# Patient Record
Sex: Male | Born: 1990 | Race: White | Hispanic: No | Marital: Single
Health system: Southern US, Community
[De-identification: ages and names within clinical notes are randomized; demographics above are authoritative.]

---

## 2014-10-05 ENCOUNTER — Observation Stay: Payer: Self-pay | Admitting: Internal Medicine

## 2014-12-20 NOTE — Consult Note (Signed)
Referring Physician:  Wyatt Haste   Primary Care Physician:  [facility], [address], [place], [place] [address], [place] [phone]  Reason for Consult: Admit Date: 05-Oct-2014  Chief Complaint: memory loss  Reason for Consult: seizure   History of Present Illness: History of Present Illness:   seen at request of Dr. Clint Guy for memory loss;  24 yo RHD M presents to [facility] after forgetting lots of events yesterday.  Pt states that he seemed to lose about 30 minutes of yesterday and then had another episode where he forgot another 3o minutes.  Afterwards, pt had a moderate headache.  No loss of consciousness or tongue biting or incontinence noted.  Pt had similar episode in 2014 in which it was called a TIA where he lost speech and then had a severe headache afterward.  Pt notes having a headache about 1-2x/month without photophobia or N/V.    ROS:  General denies complaints   HEENT no complaints   Lungs no complaints   Cardiac no complaints   GI no complaints   GU no complaints   Musculoskeletal no complaints   Extremities no complaints   Skin no complaints   Neuro no complaints   Endocrine no complaints   Psych anxiety   Past Medical/Surgical Hx:  low testostorone level:   hypothyroidism:   TIA:   Past Medical/ Surgical Hx:  Past Medical History TIA, pituitary problems, anxiety   Past Surgical History none   Home Medications: Medication Instructions Last Modified Date/Time  liothyronine 15 microgram(s) orally 2 times a day 16-Feb-16 02:00  Armour Thyroid 30 mg oral tablet 3 tab(s) orally once a day 16-Feb-16 11:51   Allergies:  No Known Allergies:   Allergies:  Allergies NKDA   Social/Family History: Employment Status: unemployed  Lives With: alone  Living Arrangements: apartment  Social History: rare EtOh, no illicits, no tob  Family History: M with Migraines, no seizures, no stroke   Vital Signs: **Vital  Signs.:   16-Feb-16 07:42  Vital Signs Type Routine  Temperature Temperature (F) 97.8  Celsius 36.5  Temperature Source oral  Pulse Pulse 70  Respirations Respirations 18  Systolic BP Systolic BP 96  Diastolic BP (mmHg) Diastolic BP (mmHg) 58  Mean BP 70  Pulse Ox % Pulse Ox % 100  Pulse Ox Activity Level  At rest  Oxygen Delivery Room Air/ 21 %   Physical Exam: General: nl weight, NAD  HEENT: normocephalic, sclera nonicteric, oropharynx clear  Neck: supple, no JVD, no bruits  Chest: CTA B, no wheezing  Cardiac: RRR, no murmurs, no edema, 2+ pulses  Extremities: no C/C/E, FROM   Neurologic Exam: Mental Status: alert and oriented x 3, normal speech and language, follows complex commands  Cranial Nerves: PERRLA, EOMI, nl VF, face symmetric, tongue midline, shoulder shrug equal  Motor Exam: 5/5 B normal, tone, no tremor  Deep Tendon Reflexes: 2+/4 B, plantars downgoing B, no Hoffman  Sensory Exam: pinprick, temperature, and vibration intact B  Coordination: FTN and HTS WNL, nl RAM, nl gait   Lab Results: Thyroid:  15-Feb-16 21:30   Thyroid Stimulating Hormone  < 0.010 (0.45-4.50 (IU = International Unit)  ----------------------- Pregnant patients have  different reference  ranges for TSH:  - - - - - - - - - -  Pregnant, first trimetser:  0.36 - 2.50 uIU/mL)  LabObservation:  16-Feb-16 11:30   OBSERVATION PACS Image dcm.[id number].sa=[id number]  Hepatic:  15-Feb-16 21:30  Bilirubin, Total 0.2  Alkaline Phosphatase 66  SGPT (ALT) 25  SGOT (AST) 28  Total Protein, Serum 7.3  Albumin, Serum 3.7  Routine Chem:  16-Feb-16 03:56   Glucose, Serum  101  BUN  20  Creatinine (comp) 1.02  Sodium, Serum 142  Potassium, Serum 3.7  Chloride, Serum 107  CO2, Serum 26  Calcium (Total), Serum 8.6  Anion Gap 9  Osmolality (calc) 286  eGFR (African American) >60  eGFR (Non-African American) >60 (eGFR values <74mL/min/1.73 m2 may be an indication of chronic kidney  disease (CKD). Calculated eGFR, using the MRDR Study equation, is useful in  patients with stable renal function. The eGFR calculation will not be reliable in acutely ill patients when serum creatinine is changing rapidly. It is not useful in patients on dialysis. The eGFR calculation may not be applicable to patients at the low and high extremes of body sizes, pregnant women, and vegetarians.)  Urine Drugs:  [id number] 40:34   Tricyclic Antidepressant, Ur Qual (comp) NEGATIVE (Result(s) reported on 05 Oct 2014 at 11:08PM.)  Amphetamines, Urine Qual. NEGATIVE  MDMA, Urine Qual. NEGATIVE  Cocaine Metabolite, Urine Qual. NEGATIVE  Opiate, Urine qual NEGATIVE  Phencyclidine, Urine Qual. NEGATIVE  Cannabinoid, Urine Qual. NEGATIVE  Barbiturates, Urine Qual. NEGATIVE  Benzodiazepine, Urine Qual. NEGATIVE (----------------- The URINE DRUG SCREEN provides only a preliminary, unconfirmed analytical test result and should not be used for non-medical  purposes.  Clinical consideration and professional judgment should be  applied to any positive drug screen result due to possible interfering substances.  A more specific alternate chemical method must be used in order to obtain a confirmed analytical result.  Gas chromatography/mass spectrometry (GC/MS) is the preferred confirmatory method.)  Methadone, Urine Qual. NEGATIVE  Cardiac:  15-Feb-16 21:30   Troponin I < 0.02 (0.00-0.05 0.05 ng/mL or less: NEGATIVE  Repeat testing in 3-6 hrs  if clinically indicated. >0.05 ng/mL: POTENTIAL  MYOCARDIAL INJURY. Repeat  testing in 3-6 hrs if  clinically indicated. NOTE: An increase or decrease  of 30% or more on serial  testing suggests a  clinically important change)  Routine UA:  15-Feb-16 22:10   Color (UA) Yellow  Clarity (UA) Cloudy  Glucose (UA) 50 mg/dL  Bilirubin (UA) Negative  Ketones (UA) Trace  Specific Gravity (UA) 1.020  Blood (UA) Negative  pH (UA) 5.0  Protein (UA)  Negative  Nitrite (UA) Negative  Leukocyte Esterase (UA) Negative (Result(s) reported on 05 Oct 2014 at 10:57PM.)  RBC (UA) NONE SEEN  WBC (UA) NONE SEEN  Bacteria (UA) NONE SEEN  Epithelial Cells (UA) 4 /HPF  Mucous (UA) PRESENT  Uric Acid Crystal (UA) PRESENT (Result(s) reported on 05 Oct 2014 at 10:57PM.)  Routine Hem:  15-Feb-16 21:30   Erythrocyte Sed Rate 3 (Result(s) reported on 06 Oct 2014 at 12:17AM.)  16-Feb-16 03:56   WBC (CBC)  13.0  RBC (CBC) 4.94  Hemoglobin (CBC) 13.2  Hematocrit (CBC) 41.0  Platelet Count (CBC) 283  MCV 83  MCH 26.8  MCHC 32.2  RDW 13.5  Neutrophil % 63.2  Lymphocyte % 23.6  Monocyte % 11.1  Eosinophil % 1.7  Basophil % 0.4  Neutrophil #  8.2  Lymphocyte # 3.1  Monocyte #  1.4  Eosinophil # 0.2  Basophil # 0.1 (Result(s) reported on 06 Oct 2014 at 05:17AM.)   Radiology Results: CT:    15-Feb-16 21:20, CT Head Without Contrast  CT Head Without Contrast   REASON FOR EXAM:    Memory  deficits, hx of TIA with slurred speech 79yr   ago. confusion.  COMMENTS:       PROCEDURE: CT  - CT HEAD WITHOUT CONTRAST  - Oct 05 2014  9:20PM     CLINICAL DATA:  Memory deficit, history of TIA, slurred speech    EXAM:  CTHEAD WITHOUT CONTRAST    TECHNIQUE:  Contiguous axial images were obtained from the base of the skull  through the vertex without intravenous contrast.  COMPARISON:  None.    FINDINGS:  No skull fracture is noted. There is mucosal thickening with partial  opacification bilateral ethmoid air cells. The mastoid air cells are  unremarkable.    No intracranial hemorrhage, mass effect or midline shift. No  hydrocephalus. No intra or extra-axial fluid collection. No definite  acute cortical infarction.No mass lesion is noted on this  unenhanced scan.     IMPRESSION:  No acute intracranial abnormality. There is mucosal thickening with  partial opacification bilateral ethmoid air cells.  Electronically Signed    By: Lahoma Crocker  M.D.    On: 02/15/201621:29         Verified By: Ephraim Hamburger, M.D.,   Radiology Impression: Radiology Impression: MRI personally reviewed by me and is normal   Impression/Recommendations: Recommendations:   prior notes reviewed by me reviewed by me   Probable seizure-  type is unknown but likely simple partial but could also be generalized at this age;  it appears that the episode that happened in 2014 was probably the same thing EEG as outpatient start Topamax $RemoveBefor'25mg'KcivVoIEruap$  qHS x 1 week then $RemoveB'25mg'mxrJpHNO$  BID x 1 week then $RemoveB'50mg'uSSDOsmx$  BID pt advised against driving or operating heavy machinery x 6 months needs to f/u with Dr. Melrose Nakayama in 3-4 weeks, please call with questions ok to d/c today  Electronic Signatures: Jamison Neighbor (MD)  (Signed 16-Feb-16 13:32)  Authored: REFERRING PHYSICIAN, Primary Care Physician, Consult, History of Present Illness, Review of Systems, PAST MEDICAL/SURGICAL HISTORY, HOME MEDICATIONS, ALLERGIES, Social/Family History, NURSING VITAL SIGNS, Physical Exam-, LAB RESULTS, RADIOLOGY RESULTS, Recommendations   Last Updated: 16-Feb-16 13:32 by Jamison Neighbor (MD)

## 2014-12-20 NOTE — H&P (Signed)
PATIENT NAME:  Chris Chen, Chris Chen MR#:  [id number] DATE OF BIRTH:  08/29/1990  DATE OF ADMISSION:  10/05/2014  PRIMARY CARE PHYSICIAN: Nonlocal.   CHIEF COMPLAINT: Memory loss.   HISTORY OF PRESENT ILLNESS: A 24 year old Caucasian gentleman with a history of TIA presenting with memory loss. In his usual state of health. He was exercising doing cardiovascular exercise today, states that he had an episode of memory loss, lost about 30 minutes in total. Had a repeat episode at dinner, missing about an hour. No witnessed seizure activity during this. Denies any further symptomatology. Brought to the hospital for further workup and evaluation of symptoms. Case was discussed with neurology by the ED staff, who recommended admission MRI as well as EEG.  REVIEW OF SYSTEMS:  CONSTITUTIONAL: Denies fevers, chills, fatigue, weakness.  EYES: Denies blurred vision, double vision, eye pain. EARS, NOSE, AND THROAT: Denies tinnitus, ear pain, hearing loss. RESPIRATORY: Denies cough or shortness of breath. CARDIOVASCULAR: Denies chest pain, palpitations, edema. GASTROINTESTINAL: Denies nausea, vomiting, diarrhea, abdominal pain. GENITOURINARY: Denies dysuria or hematuria. ENDOCRINE: Denies nocturia or thyroid problems. HEMATOLOGIC AND LYMPHATIC: Denies easy bruising, bleeding.  SKIN: Denies rash or lesion.  MUSCULOSKELETAL: Denies pain in neck, back, shoulder, knees, hips or arthritic symptoms.  NEUROLOGIC: Denies paralysis, paresthesias, weakness, dysarthria, tremor, headache.  PSYCHIATRIC: Denies anxiety or depressive symptoms.  Otherwise, full review of systems performed by me is negative.  PAST MEDICAL HISTORY: Includes questionable transient ischemic attack as well as hypothyroidism.   SOCIAL HISTORY: Denies any tobacco use. Positive for occasional alcohol use. Denies IV drug use.  FAMILY HISTORY: Denies any known cardiovascular or pulmonary disorders.   ALLERGIES: No known drug allergies.   HOME  MEDICATIONS: Include Armour Thyroid 50 mg p.o. q. daily, liothyronine 15 mcg p.o. b.i.d.   PHYSICAL EXAMINATION:  VITAL SIGNS: Temperature 98.2; heart rate 106; respirations 18; blood pressure 164/71, currently 126/60; saturating 98% on room air. Weight 78 kg, BMI of 24.7.  GENERAL: Well-nourished, well-developed Caucasian male who was in no acute distress.  HEAD: Normocephalic, atraumatic.  EYES: Pupils equal, round, reactive to light. Extraocular muscles intact. No scleral icterus.  MOUTH: Moist mucosal membranes. Dentition intact. No abscess noted.  EAR, NOSE, THROAT: Clear without exudates. No external lesions. NECK: Supple. No thyromegaly. No nodules or JVD.  PULMONARY: Clear to auscultation bilaterally without wheezes, rales, rhonchi. No use of accessory muscles. Good respiratory effort. CHEST: Nontender to palpation. CARDIOVASCULAR: S1, S2. Regular rate, rhythm. No murmurs, rubs, or gallops. No edema. Pedal pulses 2+ bilaterally. GASTROINTESTINAL: Soft, nontender, nondistended. No masses. Positive bowel sounds. No hepatosplenomegaly. MUSCULOSKELETAL: No swelling, clubbing, or edema. Range of motion full in all extremities.  NEUROLOGIC: Cranial nerves II through XII intact. No gross focal neurological deficits. Sensation intact. Reflexes intact. Pronator drift within normal limits. Strength 5/5 in all extremities including proximal and distal flexion and extension.  SKIN: No ulceration, lesions, rashes, or cyanosis. Skin warm, dry. Turgor intact.  PSYCHIATRIC: Mood and affect within normal limits. The patient is awake, alert, oriented x 3. Insight and judgment intact.   LABORATORY DATA: CT, head, performed reveals no acute intracranial process. Remainder of laboratory data: Sodium of 137, potassium 4.1, chloride 104, bicarbonate of 26, BUN 20, creatinine 1.22, glucose 94. LFTs within normal limits. Urine drug screen normal. WC 16.9, hemoglobin 14, platelets of 310,000. Urinalysis within  normal limits.   ASSESSMENT AND PLAN: A 24 year old Caucasian gentleman with a history of questionable transient ischemic attack presenting with memory loss. The case was discussed  with neurology from the Emergency Room staff, who thinks this could potentially be seizure activity. 1.  Memory loss.  Neurology suspects this is seizure activity and requests getting an MRI as well as EEG.  2.  Hypothyroidism. Continue with home medication.  3.  Venous thromboembolism prophylaxis with heparin subcutaneous.  CODE STATUS: The patient is a full code.  TIME SPENT: 45 minutes.   ____________________________ Cletis Athensavid K. Einar Nolasco, MD dkh:ST D: 10/05/2014 23:37:05 ET T: 10/06/2014 00:08:47 ET JOB#: [id number]  cc: Cletis Athensavid K. Sakari Raisanen, MD, <Dictator> Weldon Synetta ShadowK Daneil Beem MD ELECTRONICALLY SIGNED 10/06/2014 20:36

## 2014-12-20 NOTE — Discharge Summary (Signed)
PATIENT NAME:  Chris Chen, Chris Chen DATE OF BIRTH:  05-Jan-1991  DATE OF ADMISSION:  10/05/2014 DATE OF DISCHARGE:  10/06/2014  ADMITTING DIAGNOSIS: Temporary memory loss.    DISCHARGE DIAGNOSES:  1.  Memory loss, possibly related to seizure per neurology. He started on antiseizure medications. MRI of the brain was negative.  2.  Questionable history of transient ischemic attack, as per patient's report.  3.  Hypothyroidism.   CONSULTANTS: Troy SineMatthew C. Katrinka BlazingSmith, MD   LABORATORY AND RADIOGRAPHIC FINDINGS: MRI of the brain was negative. CT scan of the head was negative. Admitting glucose 94, BUN 20, creatinine 1.22, sodium 137, potassium 4.1, chloride 104, CO2 of 26, calcium 8.6. LFTs were normal. TSH was less than 0.010. Toxic urine drug screen was negative. Admitting WBC count was 16.9, hemoglobin 14, platelet count was 310,000. Urinalysis was negative.   HOSPITAL COURSE: Please refer to H and P done by the admitting physician. The patient is a 24 year old white male who was exercising and an episode where he stated that he could not remember anything for 45 minutes. The patient came to the ER with these symptoms. The ER physician spoke to do a neurologist, Dr. Katrinka BlazingSmith, who felt that the patient may have had a seizure. The patient was admitted under observation with the plan getting an MRI. He had MRI, which was negative. Initially, the plan was for him to get an EEG, but Dr. Katrinka BlazingSmith felt that this was not necessary. At this time, the patient is doing better and is being treated for possible seizure.   DISCHARGE MEDICATIONS: Liothyronine 15 mcg orally 2 times a day; Armour thyroid 30 mg 3 tablets daily; Topamax 25 mg 1 tablet at bedtime x 1 week, then Topamax 25 mg p.o. b.i.d. x 1 week, then Topamax 50 mg p.o. b.i.d. indefinitely.   DIET: Regular.   ACTIVITY: As tolerated.   FO[facility]UP[facility]-4 weeks.    TIME SPENT: 35 minutes on this discharge.      ____________________________ Lacie ScottsShreyang H. Allena KatzPatel, MD shp:bm D: 10/06/2014 21:02:06 ET T: 10/07/2014 05:55:40 ET JOB#: [id number]  cc: Ladarian Bonczek H. Allena KatzPatel, MD, <Dictator> Charise CarwinSHREYANG H Linnie Mcglocklin MD ELECTRONICALLY SIGNED 10/09/2014 15:56

## 2015-12-07 IMAGING — CT CT HEAD WITHOUT CONTRAST
1 of 2 series · 16 of 30 positions shown, 20 images · non-contrast
Comparison: None.

CLINICAL DATA: Memory deficit, history of TIA, slurred speech

EXAM:
CT HEAD WITHOUT CONTRAST
TECHNIQUE: Contiguous axial images were obtained from the base of the skull
through the vertex without intravenous contrast.

[Series 2: head wo · axial · 0.46mm/px · z∈[-148,-23]mm · 16 of 30 slices shown, 20 images]
[im 2/30  brain]
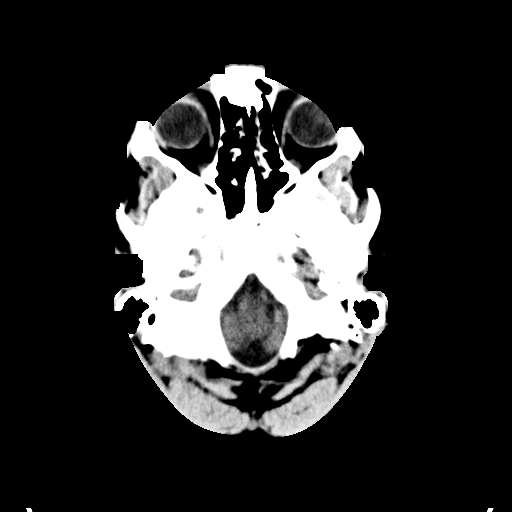
[im 2/30  bone]
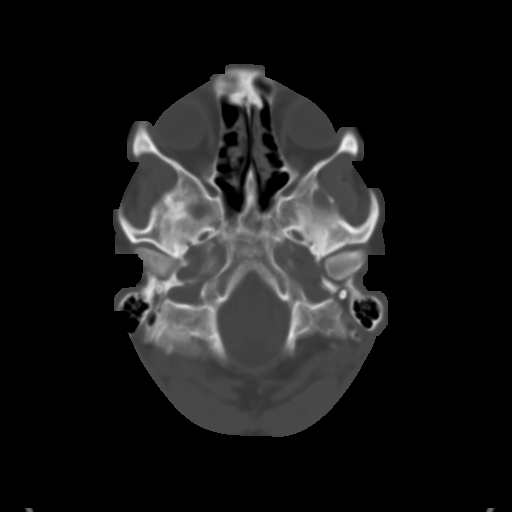
[im 4/30  brain]
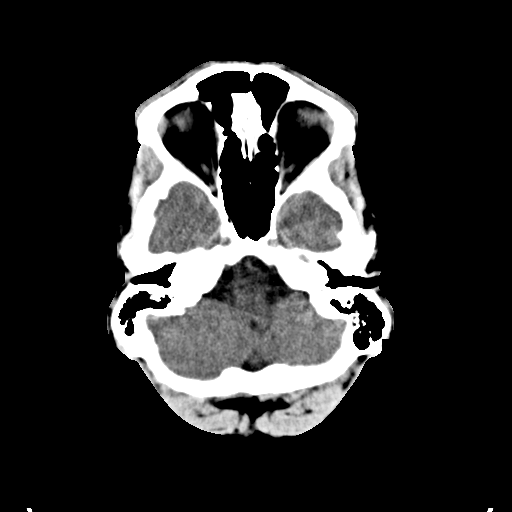
[im 5/30  brain]
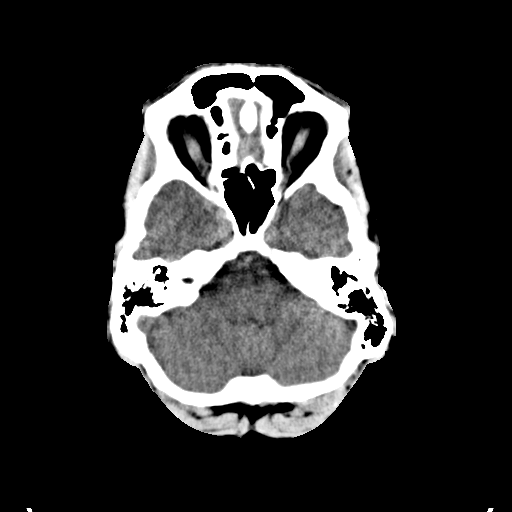
[im 8/30  brain]
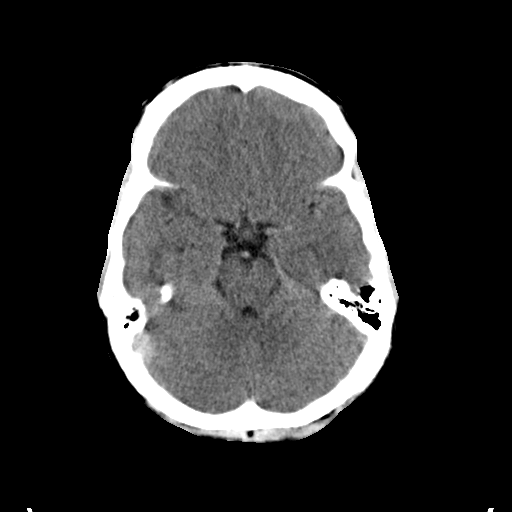
[im 9/30  brain]
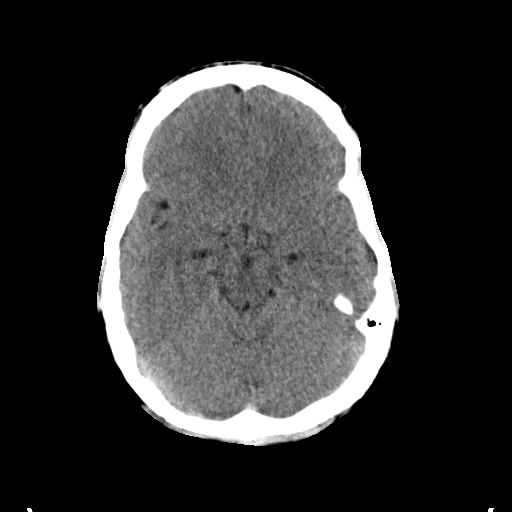
[im 9/30  bone]
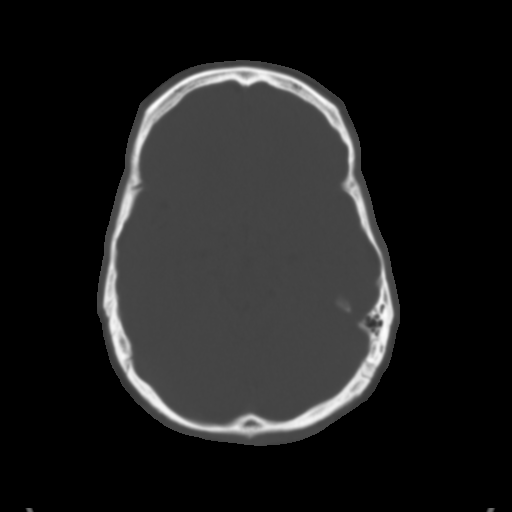
[im 10/30  brain]
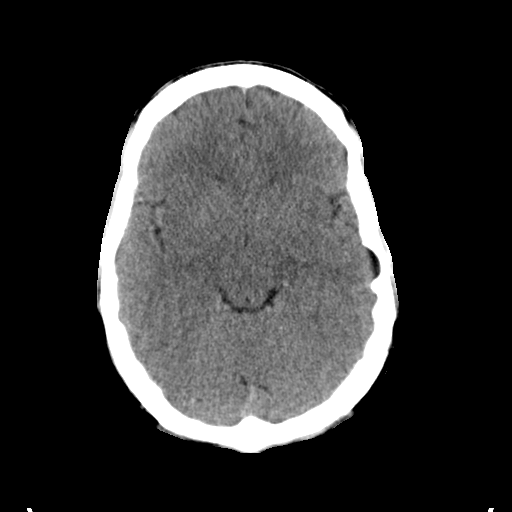
[im 13/30  brain]
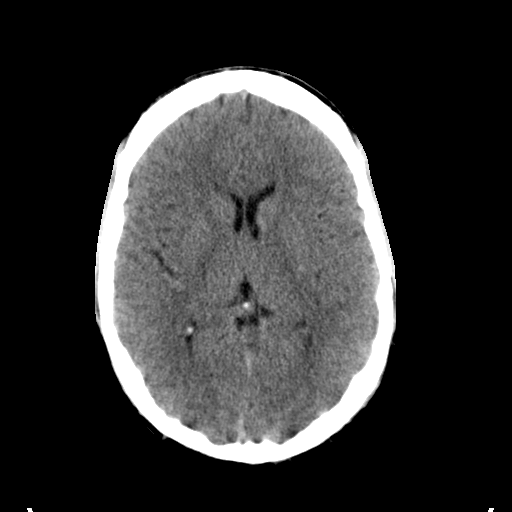
[im 14/30  brain]
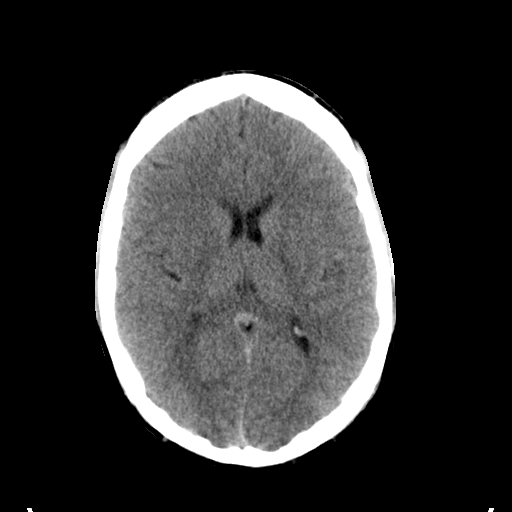
[im 16/30  brain]
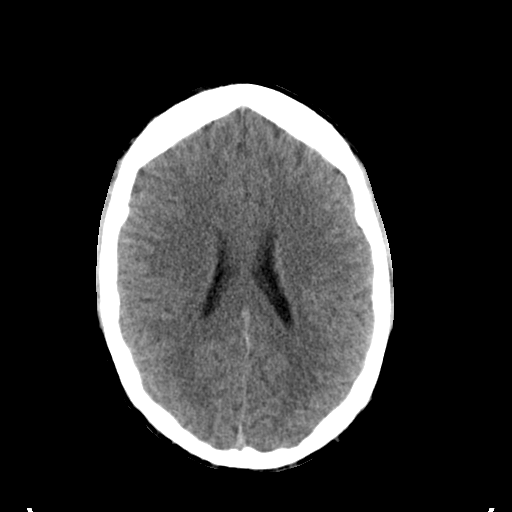
[im 16/30  bone]
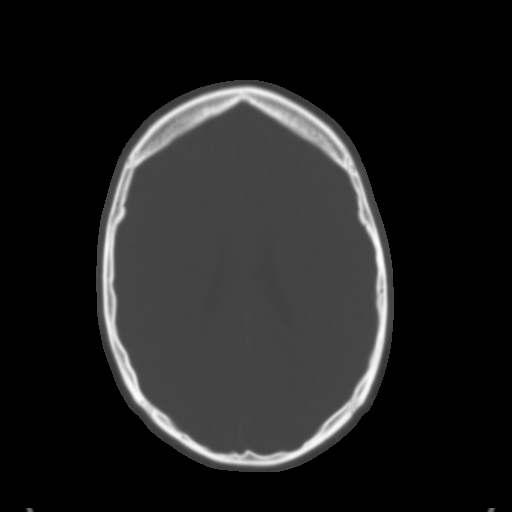
[im 17/30  brain]
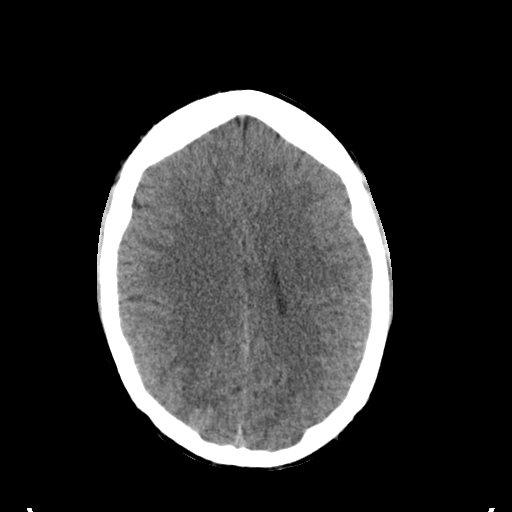
[im 20/30  brain]
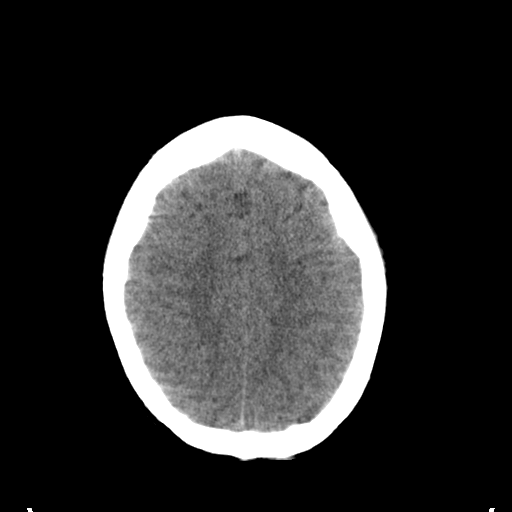
[im 21/30  brain]
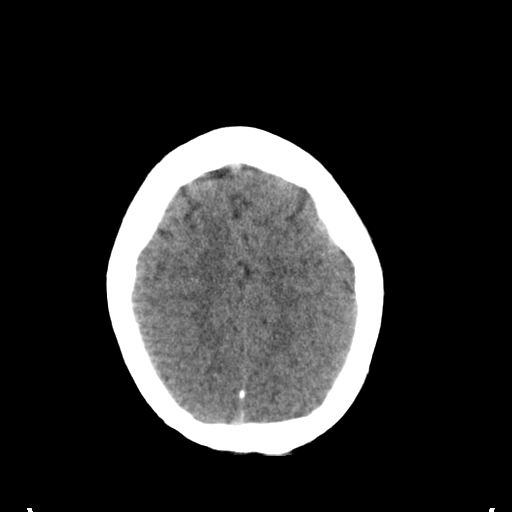
[im 22/30  brain]
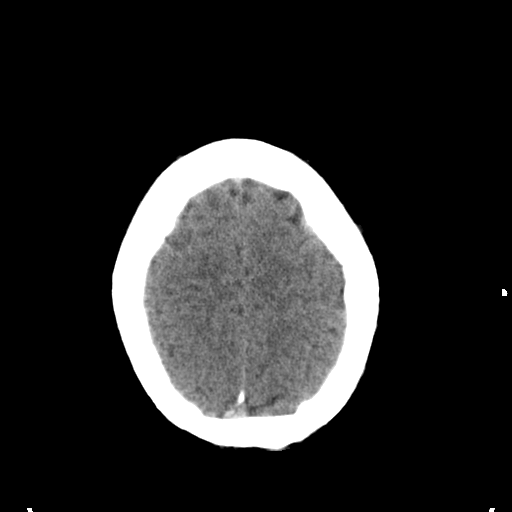
[im 22/30  bone]
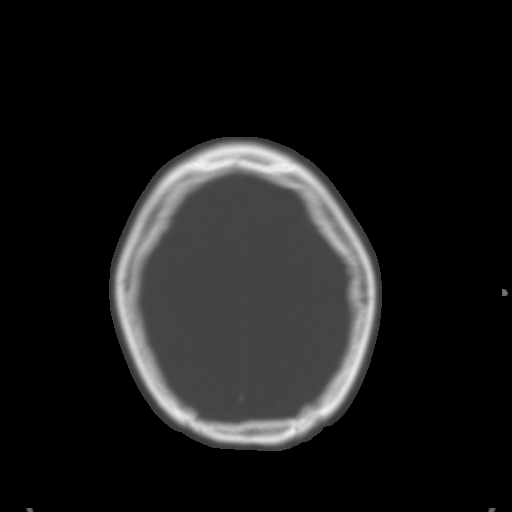
[im 25/30  brain]
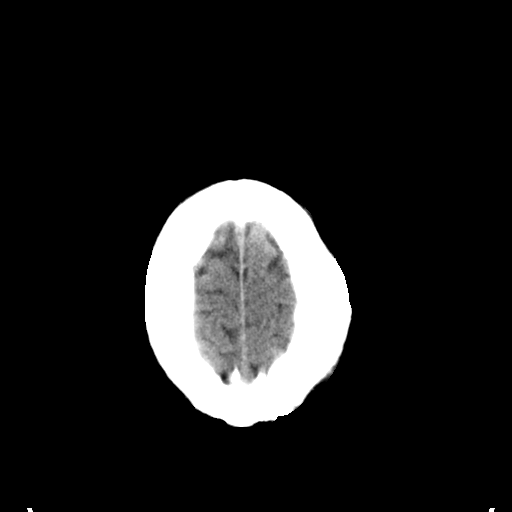
[im 26/30  brain]
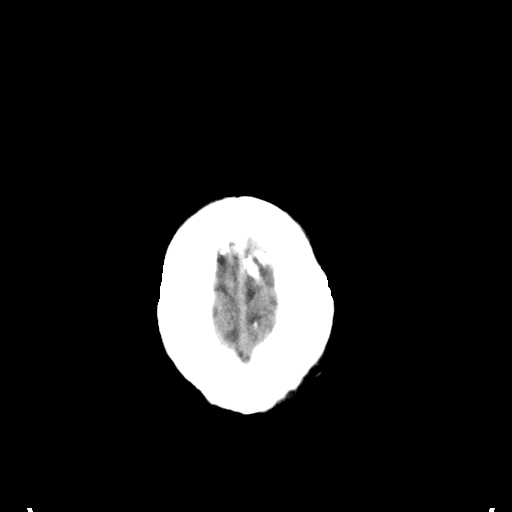
[im 28/30  brain]
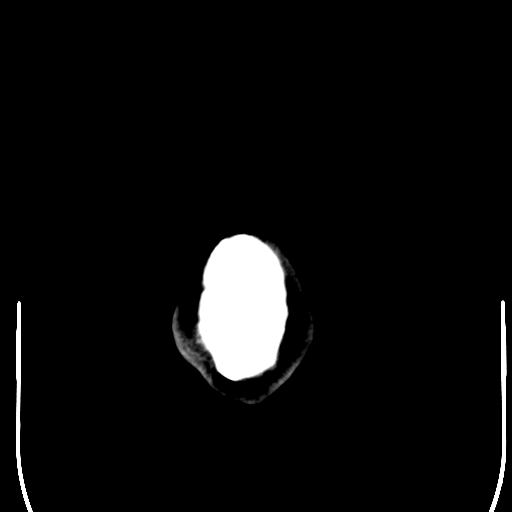

[16 of 30 positions shown; findings below may reference images not displayed]

FINDINGS: No skull fracture is noted. There is mucosal thickening with partial
opacification bilateral ethmoid air cells. The mastoid air cells are
unremarkable.

No intracranial hemorrhage, mass effect or midline shift. No
hydrocephalus. No intra or extra-axial fluid collection. No definite
acute cortical infarction. No mass lesion is noted on this
unenhanced scan.
IMPRESSION: No acute intracranial abnormality. There is mucosal thickening with
partial opacification bilateral ethmoid air cells.
